# Patient Record
Sex: Female | Born: 1985 | Race: Black or African American | Hispanic: No | Marital: Married | Smoking: Current every day smoker
Health system: Southern US, Community
[De-identification: ages and names within clinical notes are randomized; demographics above are authoritative.]

---

## 2004-10-04 ENCOUNTER — Ambulatory Visit (HOSPITAL_COMMUNITY): Admission: RE | Admit: 2004-10-04 | Discharge: 2004-10-04 | Payer: Self-pay | Admitting: *Deleted

## 2004-10-10 ENCOUNTER — Ambulatory Visit: Payer: Self-pay | Admitting: Family Medicine

## 2004-10-23 ENCOUNTER — Ambulatory Visit: Payer: Self-pay | Admitting: *Deleted

## 2004-10-23 ENCOUNTER — Ambulatory Visit (HOSPITAL_COMMUNITY): Admission: RE | Admit: 2004-10-23 | Discharge: 2004-10-23 | Payer: Self-pay | Admitting: *Deleted

## 2004-11-21 ENCOUNTER — Ambulatory Visit: Payer: Self-pay | Admitting: Family Medicine

## 2004-11-25 ENCOUNTER — Ambulatory Visit (HOSPITAL_COMMUNITY): Admission: RE | Admit: 2004-11-25 | Discharge: 2004-11-25 | Payer: Self-pay | Admitting: *Deleted

## 2004-11-25 ENCOUNTER — Inpatient Hospital Stay (HOSPITAL_COMMUNITY): Admission: AD | Admit: 2004-11-25 | Discharge: 2004-11-25 | Payer: Self-pay | Admitting: Obstetrics & Gynecology

## 2004-12-05 ENCOUNTER — Ambulatory Visit: Payer: Self-pay | Admitting: Family Medicine

## 2004-12-19 ENCOUNTER — Ambulatory Visit: Payer: Self-pay | Admitting: Family Medicine

## 2005-01-02 ENCOUNTER — Ambulatory Visit: Payer: Self-pay | Admitting: Family Medicine

## 2005-01-09 ENCOUNTER — Ambulatory Visit: Payer: Self-pay | Admitting: Family Medicine

## 2005-01-23 ENCOUNTER — Ambulatory Visit: Payer: Self-pay | Admitting: Family Medicine

## 2005-02-06 ENCOUNTER — Ambulatory Visit: Payer: Self-pay | Admitting: Family Medicine

## 2005-02-18 ENCOUNTER — Inpatient Hospital Stay (HOSPITAL_COMMUNITY): Admission: AD | Admit: 2005-02-18 | Discharge: 2005-02-18 | Payer: Self-pay | Admitting: *Deleted

## 2005-02-19 ENCOUNTER — Inpatient Hospital Stay (HOSPITAL_COMMUNITY): Admission: AD | Admit: 2005-02-19 | Discharge: 2005-02-19 | Payer: Self-pay | Admitting: Obstetrics & Gynecology

## 2005-02-19 ENCOUNTER — Ambulatory Visit: Payer: Self-pay | Admitting: Obstetrics & Gynecology

## 2005-02-19 ENCOUNTER — Ambulatory Visit: Payer: Self-pay | Admitting: Certified Nurse Midwife

## 2005-02-21 ENCOUNTER — Inpatient Hospital Stay (HOSPITAL_COMMUNITY): Admission: AD | Admit: 2005-02-21 | Discharge: 2005-02-21 | Payer: Self-pay | Admitting: *Deleted

## 2005-02-21 ENCOUNTER — Ambulatory Visit: Payer: Self-pay | Admitting: Obstetrics and Gynecology

## 2005-02-26 ENCOUNTER — Inpatient Hospital Stay (HOSPITAL_COMMUNITY): Admission: AD | Admit: 2005-02-26 | Discharge: 2005-02-28 | Payer: Self-pay | Admitting: Obstetrics and Gynecology

## 2005-02-26 ENCOUNTER — Ambulatory Visit: Payer: Self-pay | Admitting: Certified Nurse Midwife

## 2006-02-25 ENCOUNTER — Ambulatory Visit: Payer: Self-pay | Admitting: Obstetrics and Gynecology

## 2008-07-06 ENCOUNTER — Emergency Department (HOSPITAL_COMMUNITY): Admission: EM | Admit: 2008-07-06 | Discharge: 2008-07-06 | Payer: Self-pay | Admitting: Emergency Medicine

## 2009-01-09 ENCOUNTER — Emergency Department (HOSPITAL_COMMUNITY): Admission: EM | Admit: 2009-01-09 | Discharge: 2009-01-09 | Payer: Self-pay | Admitting: Emergency Medicine

## 2011-01-24 NOTE — Group Therapy Note (Signed)
[name], [name] NO.:  [id number]   MEDICAL RECORD NO.:  [id number]          PATIENT TYPE:  WOC   LOCATION:  [facility]                   FACILITY:  [facility]   PHYSICIAN:  Montey Hora, M.D.    DATE OF BIRTH:  05-01-1986   DATE OF SERVICE:                                    CLINIC NOTE   REASON FOR VISIT:  To discuss getting a tubal ligation.   This is a 25 year old G2, P2, 0, 0, 2 who is absolutely convinced that she  does not want any more children. She had two normal spontaneous vaginal  deliveries without complication.  Her last baby was 1 year ago.  She has had  no abdominal surgeries. No medical problems.  She takes no medications.   ALLERGIES:  No known drug allergies.   PAST MEDICAL HISTORY:  Normal pregnancy x2. Normal Pap smears. Last  menstrual period February 10, 2006 and it was normal.   SOCIAL HISTORY:  No tobacco or alcohol.   PHYSICAL EXAMINATION:  VITAL SIGNS:  Temperature is 98, pulse is 76, blood  pressure is 120/78 and weight is 139.7.  She is 5 feet, 6 inches.  GENERAL:  WDWN. NAD.  HEART:  RRR  without murmur.  LUNGS:  CTAB.  Normal respiratory effort.  NECK:  Without lymphadenopathy or thyromegaly.  ABDOMEN:  Soft and nontender.  EXTREMITIES:  Ankles without swelling, 2+ DP pulses bilaterally.  PELVIC EXAM:  Deferred.   ASSESSMENT AND PLAN:  1.  Desire for surgical sterilization. Risks and benefits discussed      including the risk of bleeding, infection, or even death from surgery or      anesthesia. The patient advised that tubal reversal is costly and can be      unsuccessful.  She is further advised of the risk of ectopic pregnancy      after tubal ligation and advised that the risk of that may be roughly      1:300.  The patient wishes to proceed, 90-day papers are signed.  She      understands that the OB/GYN office will be contacting her regarding an      appointment and that blood work will be done prior to her surgery.  She      also understands that it is an outpatient surgery and she most likely      will be discharged home the      day of the surgery.  She states her understanding of all of these things      and wishes to proceed with the procedure.           ______________________________  Montey Hora, M.D.     KR/MEDQ  D:  02/25/2006  T:  02/25/2006  Job:  [id number]

## 2015-01-29 ENCOUNTER — Emergency Department (HOSPITAL_COMMUNITY)
Admission: EM | Admit: 2015-01-29 | Discharge: 2015-01-29 | Discharge status: Against Medical Advice | Payer: Self-pay | Attending: Emergency Medicine | Admitting: Emergency Medicine

## 2015-01-29 ENCOUNTER — Encounter (HOSPITAL_COMMUNITY): Payer: Self-pay | Admitting: Family Medicine

## 2015-01-29 DIAGNOSIS — Z720 Tobacco use: Secondary | ICD-10-CM | POA: Insufficient documentation

## 2015-01-29 DIAGNOSIS — M545 Low back pain: Secondary | ICD-10-CM | POA: Insufficient documentation

## 2015-01-29 NOTE — ED Notes (Signed)
Pt states she needs to go pick up daughter and cannot wait to be seen, this RN informed pt of delay and reassured pt that she would be seen shortly. Pt verbalized understanding but wishes to leave. NAD noted and pt ambulatory.

## 2015-01-29 NOTE — ED Notes (Signed)
Pt here for lower back pain. sts chronic. sts x 1 week.

## 2015-03-29 ENCOUNTER — Encounter (HOSPITAL_COMMUNITY): Payer: Self-pay

## 2015-03-29 ENCOUNTER — Emergency Department (HOSPITAL_COMMUNITY)
Admission: EM | Admit: 2015-03-29 | Discharge: 2015-03-29 | Disposition: A | Discharge status: Home / Self Care | Payer: Worker's Compensation | Attending: Emergency Medicine | Admitting: Emergency Medicine

## 2015-03-29 DIAGNOSIS — Y9289 Other specified places as the place of occurrence of the external cause: Secondary | ICD-10-CM | POA: Diagnosis not present

## 2015-03-29 DIAGNOSIS — Y990 Civilian activity done for income or pay: Secondary | ICD-10-CM | POA: Insufficient documentation

## 2015-03-29 DIAGNOSIS — Y9389 Activity, other specified: Secondary | ICD-10-CM | POA: Diagnosis not present

## 2015-03-29 DIAGNOSIS — Z720 Tobacco use: Secondary | ICD-10-CM | POA: Insufficient documentation

## 2015-03-29 DIAGNOSIS — W458XXA Other foreign body or object entering through skin, initial encounter: Secondary | ICD-10-CM | POA: Insufficient documentation

## 2015-03-29 DIAGNOSIS — Z23 Encounter for immunization: Secondary | ICD-10-CM | POA: Insufficient documentation

## 2015-03-29 DIAGNOSIS — S61512A Laceration without foreign body of left wrist, initial encounter: Secondary | ICD-10-CM

## 2015-03-29 MED ORDER — TETANUS-DIPHTH-ACELL PERTUSSIS 5-2.5-18.5 LF-MCG/0.5 IM SUSP
0.5000 mL | Freq: Once | INTRAMUSCULAR | Status: AC
  Administered 2015-03-29: 0.5 mL via INTRAMUSCULAR
  Filled 2015-03-29: qty 0.5

## 2015-03-29 MED ORDER — LIDOCAINE HCL (PF) 1 % IJ SOLN
5.0000 mL | Freq: Once | INTRAMUSCULAR | Status: AC
  Administered 2015-03-29: 5 mL
  Filled 2015-03-29: qty 5

## 2015-03-29 NOTE — ED Notes (Signed)
Pt reports she was at work tonight and accidentally cut her left lateral wrist with a metal blade. Lac appears to be about 1cm, superficial, no bleeding noted. Cap refill <3 seconds.

## 2015-03-29 NOTE — ED Provider Notes (Signed)
TIME SEEN: 2:54 AM   CHIEF COMPLAINT: Laceration to left lateral wrist  HPI: [name] is a 29 y.o. female who presents to the Emergency Department complaining of laceration to left lateral wrist with a boxcutter metal blade. Pt is right handed. Last tetanus shot was many years ago. She denies associated LOC. She denies S/I or self injury. She denies any associated injuries.  ROS: See HPI Constitutional: no fever  Eyes: no drainage  ENT: no runny nose   Cardiovascular:  no chest pain  Resp: no SOB  GI: no vomiting GU: no dysuria Integumentary: no rash; laceration to left lateral wrist Allergy: no hives  Musculoskeletal: no leg swelling  Neurological: no slurred speech ROS otherwise negative  PAST MEDICAL HISTORY/PAST SURGICAL HISTORY:  History reviewed. No pertinent past medical history.  MEDICATIONS:  Prior to Admission medications   Not on File    ALLERGIES:  No Known Allergies  SOCIAL HISTORY:  History  Substance Use Topics  . Smoking status: Current Every Day Smoker  . Smokeless tobacco: Not on file  . Alcohol Use: No    FAMILY HISTORY: No family history on file.  EXAM: Triage Vitals: BP 129/81 mmHg  Pulse 93  Temp(Src) 98.2 F (36.8 C) (Oral)  Resp 20  Wt 183 lb (83.008 kg)  SpO2 100%  LMP 03/05/2015  CONSTITUTIONAL: Alert and oriented and responds appropriately to questions. Well-appearing; well-nourished HEAD: Normocephalic EYES: Conjunctivae clear, PERRL ENT: normal nose; no rhinorrhea; moist mucous membranes; pharynx without lesions noted NECK: Supple, no meningismus, no LAD  CARD: RRR; S1 and S2 appreciated; no murmurs, no clicks, no rubs, no gallops RESP: Normal chest excursion without splinting or tachypnea; breath sounds clear and equal bilaterally; no wheezes, no rhonchi, no rales, no hypoxia or respiratory distress, speaking full sentences ABD/GI: Normal bowel sounds; non-distended; soft, non-tender, no rebound, no guarding, no  peritoneal signs BACK:  The back appears normal and is non-tender to palpation, there is no CVA tenderness EXT: Normal ROM in all joints; non-tender to palpation; no edema; normal capillary refill; no cyanosis, no calf tenderness or swelling, 2+ radial pulses bilaterally    SKIN: Normal color for age and race; warm; 1cm superficial laceration to dorsal left wrist with no tendon involvement. No bony deformity. No foreign body appreciated. NEURO: Moves all extremities equally, sensation to light touch intact diffusely, cranial nerves II through XII intact PSYCH: The patient's mood and manner are appropriate. Grooming and personal hygiene are appropriate.  MEDICAL DECISION MAKING:   DIAGNOSTIC STUDIES: Oxygen Saturation is 100% on RA, normal by my interpretation.    COORDINATION OF CARE: 2:57 AM- Discussed plans to apply suture to affected area. Pt advised of plan for treatment and pt agrees. Will update tetanus. No foreign body appreciated. No bony deformity. No tendon involvement. Neurovascular intact distally. Discussed return precautions and supportive care instructions. She verbalized understanding and is comfortable with plan.  LACERATION REPAIR Performed by: Zettie Cooley, DO Consent: Verbal consent obtained. Risks and benefits: risks, benefits and alternatives were discussed Patient identity confirmed: provided demographic data Time out performed prior to procedure Prepped and Draped in normal sterile fashion Wound explored Laceration Location: Dorsal left wrist  Laceration Length: 1cm No Foreign Bodies seen or palpated Anesthesia: local infiltration Local anesthetic: lidocaine 1% without epinephrine Anesthetic total: 1 ml Irrigation method: syringe Amount of cleaning: standard Skin closure: superficial Number of sutures or staples: Two 4.0 prolene Technique: simple interrupted Patient tolerance: Patient tolerated the procedure well with no immediate  complications.  I  personally performed the services described in this documentation, which was scribed in my presence. The recorded information has been reviewed and is accurate.   Layla Maw Christasia Angeletti, DO 03/29/15 [phone]

## 2015-03-29 NOTE — Discharge Instructions (Signed)

## 2015-10-18 ENCOUNTER — Emergency Department (HOSPITAL_COMMUNITY): Payer: Self-pay

## 2015-10-18 ENCOUNTER — Emergency Department (HOSPITAL_COMMUNITY): Payer: No Typology Code available for payment source

## 2015-10-18 ENCOUNTER — Emergency Department (HOSPITAL_COMMUNITY)
Admission: EM | Admit: 2015-10-18 | Discharge: 2015-10-18 | Disposition: A | Discharge status: Home / Self Care | Payer: Self-pay | Attending: Emergency Medicine | Admitting: Emergency Medicine

## 2015-10-18 ENCOUNTER — Encounter (HOSPITAL_COMMUNITY): Payer: Self-pay | Admitting: Emergency Medicine

## 2015-10-18 DIAGNOSIS — Y9389 Activity, other specified: Secondary | ICD-10-CM | POA: Insufficient documentation

## 2015-10-18 DIAGNOSIS — F17200 Nicotine dependence, unspecified, uncomplicated: Secondary | ICD-10-CM | POA: Insufficient documentation

## 2015-10-18 DIAGNOSIS — S43035A Inferior dislocation of left humerus, initial encounter: Secondary | ICD-10-CM | POA: Insufficient documentation

## 2015-10-18 DIAGNOSIS — Y92410 Unspecified street and highway as the place of occurrence of the external cause: Secondary | ICD-10-CM | POA: Insufficient documentation

## 2015-10-18 DIAGNOSIS — Y998 Other external cause status: Secondary | ICD-10-CM | POA: Insufficient documentation

## 2015-10-18 DIAGNOSIS — S43005A Unspecified dislocation of left shoulder joint, initial encounter: Secondary | ICD-10-CM

## 2015-10-18 DIAGNOSIS — Z3202 Encounter for pregnancy test, result negative: Secondary | ICD-10-CM | POA: Insufficient documentation

## 2015-10-18 LAB — I-STAT BETA HCG BLOOD, ED (MC, WL, AP ONLY): I-stat hCG, quantitative: <5 m[IU]/mL (ref <5)

## 2015-10-18 MED ORDER — PROPOFOL 10 MG/ML IV BOLUS
INTRAVENOUS | Status: AC | PRN
  Administered 2015-10-18: 20 mg via INTRAVENOUS

## 2015-10-18 MED ORDER — NAPROXEN 500 MG PO TABS: 500.0000 mg | ORAL_TABLET | Freq: Two times a day (BID) | ORAL | Status: AC

## 2015-10-18 MED ORDER — PROPOFOL 10 MG/ML IV BOLUS
40.0000 mg | Freq: Once | INTRAVENOUS | Status: DC
  Filled 2015-10-18: qty 20

## 2015-10-18 MED ORDER — HYDROMORPHONE HCL 1 MG/ML IJ SOLN
1.0000 mg | Freq: Once | INTRAMUSCULAR | Status: DC
  Filled 2015-10-18: qty 1

## 2015-10-18 MED ORDER — OXYCODONE-ACETAMINOPHEN 5-325 MG PO TABS: 2.0000 | ORAL_TABLET | ORAL | Status: AC | PRN

## 2015-10-18 MED ORDER — METHOCARBAMOL 500 MG PO TABS: 500.0000 mg | ORAL_TABLET | Freq: Two times a day (BID) | ORAL | Status: AC

## 2015-10-18 MED ORDER — HYDROMORPHONE HCL 1 MG/ML IJ SOLN
1.0000 mg | Freq: Once | INTRAMUSCULAR | Status: AC
  Administered 2015-10-18: 1 mg via INTRAMUSCULAR
  Filled 2015-10-18: qty 1

## 2015-10-18 MED ORDER — IOHEXOL 300 MG/ML  SOLN
100.0000 mL | Freq: Once | INTRAMUSCULAR | Status: AC | PRN
  Administered 2015-10-18: 100 mL via INTRAVENOUS

## 2015-10-18 NOTE — Discharge Instructions (Signed)
Shoulder Dislocation Follow-up with orthopedics. Take naproxen as anti-inflammatory. Take Robaxin as a muscle relaxer. Take Percocet for breakthrough pain. Your shoulder joint is made up of 3 bones:  The upper arm bone (humerus).  The shoulder blade (scapula).  The collarbone (clavicle). A shoulder dislocation happens when your upper arm bone moves out of its normal place in your shoulder joint. HOME CARE If You Have a Splint or Sling:  Wear it as told by your doctor.  Take it off only as told by your doctor.  Loosen it if:  Your fingers become numb and tingly.  Your fingers turn cold and blue.  Keep it clean and dry. Bathing  Do not take baths, swim, or use a hot tub until your doctor says you can. Ask your doctor if you can take showers. You may only be allowed to take sponge baths.  If your doctor says taking baths or showers is okay, cover your splint or sling with a plastic bag. Do not let the splint or sling get wet. Managing Pain, Stiffness, and Swelling  If told, put ice on the injured area.  Put ice in a plastic bag.  Place a towel between your skin and the bag.  Leave the ice on for 20 minutes, 2-3 times per day.  Move your fingers often to avoid stiffness and to lessen swelling.  Raise (elevate) the injured area above the level of your heart while you are sitting or lying down. Driving  Do not drive while you are wearing a splint or sling on a hand that you use for driving.  Do not drive or operate heavy machinery while taking pain medicine. Activity  Return to your normal activities as told by your doctor. Ask your doctor what activities are safe for you.  Do range-of-motion exercises only as told by your doctor.  Exercise your hand by squeezing a soft ball. This keeps your hand and wrist from getting stiff and swollen. General Instructions  Take over-the-counter and prescription medicines only as told by your doctor.  Do not use any tobacco  products, including cigarettes, chewing tobacco, or e-cigarettes. Tobacco can slow down healing. If you need help quitting, ask your doctor.  Keep all follow-up visits as told by your doctor. This is important. GET HELP IF:  Your splint or sling gets damaged. GET HELP RIGHT AWAY IF:  Your pain gets worse instead of better.  You lose feeling in your arm or hand.  Your arm or hand turns white and cold.   This information is not intended to replace advice given to you by your health care provider. Make sure you discuss any questions you have with your health care provider.   Document Released: 11/17/2011 Document Revised: 05/16/2015 Document Reviewed: 12/18/2014 Elsevier Interactive Patient Education [employer]
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
## 2019-04-14 ENCOUNTER — Encounter (HOSPITAL_COMMUNITY): Payer: Self-pay

## 2019-04-14 ENCOUNTER — Ambulatory Visit (HOSPITAL_COMMUNITY)
Admission: EM | Admit: 2019-04-14 | Discharge: 2019-04-14 | Disposition: A | Discharge status: Home / Self Care | Payer: Self-pay | Attending: Emergency Medicine | Admitting: Emergency Medicine

## 2019-04-14 ENCOUNTER — Other Ambulatory Visit: Payer: Self-pay

## 2019-04-14 DIAGNOSIS — T148XXA Other injury of unspecified body region, initial encounter: Secondary | ICD-10-CM

## 2019-04-14 DIAGNOSIS — M546 Pain in thoracic spine: Secondary | ICD-10-CM

## 2019-04-14 MED ORDER — CYCLOBENZAPRINE HCL 5 MG PO TABS: 5.0000 mg | ORAL_TABLET | Freq: Two times a day (BID) | ORAL | 0 refills | Status: AC | PRN

## 2019-04-14 NOTE — Discharge Instructions (Signed)
°  Flexeril (cyclobenzaprine) is a muscle relaxer and may cause drowsiness. Do not drink alcohol, drive, or operate heavy machinery while taking.  You may take 500mg  acetaminophen every 4-6 hours or in combination with ibuprofen 400-600mg  every 6-8 hours as needed for pain and inflammation.  Please follow up with family medicine in 1 week if not improving.

## 2019-04-14 NOTE — ED Provider Notes (Signed)
[facility]    CSN: [id number] Arrival date & time: 04/14/19  1615     History   Chief Complaint Chief Complaint  Patient presents with  . Back Pain    HPI [name] is a 33 y.o. female.   HPI  [name] is a 33 y.o. female presenting to UC with c/o Right side mid back pain that started yesterday.  Pain is aching and sore, 8/10. She has tried Tylenol and Ibuprofen with mild relief.  Pt believes she pulled a muscle but no specific known injury. Denies radiation of pain or numbness in arms or legs.  Denies urinary symptoms or hx of kidney stones.    History reviewed. No pertinent past medical history.  There are no active problems to display for this patient.   History reviewed. No pertinent surgical history.  OB History   No obstetric history on file.      Home Medications    Prior to Admission medications   Medication Sig Start Date End Date Taking? Authorizing Provider  cyclobenzaprine (FLEXERIL) 5 MG tablet Take 1-2 tablets (5-10 mg total) by mouth 2 (two) times daily as needed for muscle spasms. 04/14/19   Lurene ShadowPhelps, Clair Alfieri O, PA-C  methocarbamol (ROBAXIN) 500 MG tablet Take 1 tablet (500 mg total) by mouth 2 (two) times daily. 10/18/15   Patel-Mills, Lorelle FormosaHanna, PA-C  naproxen (NAPROSYN) 500 MG tablet Take 1 tablet (500 mg total) by mouth 2 (two) times daily. 10/18/15   Patel-Mills, Lorelle FormosaHanna, PA-C  oxyCODONE-acetaminophen (PERCOCET/ROXICET) 5-325 MG tablet Take 2 tablets by mouth every 4 (four) hours as needed for severe pain. 10/18/15   Patel-Mills, Lorelle FormosaHanna, PA-C    Family History History reviewed. No pertinent family history.  Social History Social History   Tobacco Use  . Smoking status: Current Every Day Smoker  . Smokeless tobacco: Never Used  Substance Use Topics  . Alcohol use: No  . Drug use: Not on file     Allergies   Patient has no known allergies.   Review of Systems Review of Systems  Constitutional: Negative for chills and fever.   Genitourinary: Positive for flank pain (Right). Negative for dysuria, frequency, hematuria and urgency.  Musculoskeletal: Positive for back pain and myalgias.     Physical Exam Triage Vital Signs ED Triage Vitals  Enc Vitals Group     BP 04/14/19 1644 (!) 130/99     Pulse Rate 04/14/19 1644 86     Resp 04/14/19 1644 18     Temp 04/14/19 1644 98.2 F (36.8 C)     Temp Source 04/14/19 1644 Oral     SpO2 04/14/19 1644 100 %     Weight 04/14/19 1645 185 lb (83.9 kg)     Height --      Head Circumference --      Peak Flow --      Pain Score 04/14/19 1645 8     Pain Loc --      Pain Edu? --      Excl. in GC? --    No data found.  Updated Vital Signs BP (!) 130/99 (BP Location: Right Arm)   Pulse 86   Temp 98.2 F (36.8 C) (Oral)   Resp 18   Wt 185 lb (83.9 kg)   LMP 03/29/2019   SpO2 100%   BMI 29.86 kg/m   Visual Acuity Right Eye Distance:   Left Eye Distance:   Bilateral Distance:    Right Eye Near:  Left Eye Near:    Bilateral Near:     Physical Exam Vitals signs and nursing note reviewed.  Constitutional:      Appearance: Normal appearance. She is well-developed.  HENT:     Head: Normocephalic and atraumatic.  Neck:     Musculoskeletal: Normal range of motion.  Cardiovascular:     Rate and Rhythm: Normal rate and regular rhythm.  Pulmonary:     Effort: Pulmonary effort is normal.     Breath sounds: Normal breath sounds.  Musculoskeletal: Normal range of motion.     Comments: No spinal tenderness. Mild tenderness to Right side thoracic muscles.  Full ROM upper and lower extremities bilaterally.   Skin:    General: Skin is warm and dry.     Findings: No erythema or rash.  Neurological:     General: No focal deficit present.     Mental Status: She is alert and oriented to person, place, and time.  Psychiatric:        Behavior: Behavior normal.      UC Treatments / Results  Labs (all labs ordered are listed, but only abnormal results are  displayed) Labs Reviewed - No data to display  EKG   Radiology No results found.  Procedures Procedures (including critical care time)  Medications Ordered in UC Medications - No data to display  Initial Impression / Assessment and Plan / UC Course  I have reviewed the triage vital signs and the nursing notes.  Pertinent labs & imaging results that were available during my care of the patient were reviewed by me and considered in my medical decision making (see chart for details).     Hx and exam c/w muscle strain Encouraged conservative tx F/u with PCP as needed.  Final Clinical Impressions(s) / UC Diagnoses   Final diagnoses:  Acute right-sided thoracic back pain  Muscle strain     Discharge Instructions      Flexeril (cyclobenzaprine) is a muscle relaxer and may cause drowsiness. Do not drink alcohol, drive, or operate heavy machinery while taking.  You may take 500mg  acetaminophen every 4-6 hours or in combination with ibuprofen 400-600mg  every 6-8 hours as needed for pain and inflammation.  Please follow up with family medicine in 1 week if not improving.    ED Prescriptions    Medication Sig Dispense Auth. Provider   cyclobenzaprine (FLEXERIL) 5 MG tablet Take 1-2 tablets (5-10 mg total) by mouth 2 (two) times daily as needed for muscle spasms. 20 tablet Noe Gens, PA-C     Controlled Substance Prescriptions [employer] Controlled Substance Registry consulted? Not Applicable   Tyrell Antonio 04/14/19 1818

## 2019-04-14 NOTE — ED Triage Notes (Signed)
Pt states she has back pain. Pt states like a pulled muscle on the right side. This started yesterday.
